# Patient Record
Sex: Male | Born: 1990 | Race: Black or African American | Hispanic: No | Marital: Single | Smoking: Current every day smoker
Health system: Southern US, Community
[De-identification: ages and names within clinical notes are randomized; demographics above are authoritative.]

---
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
## 2013-02-15 ENCOUNTER — Encounter (HOSPITAL_COMMUNITY): Payer: Self-pay | Admitting: *Deleted

## 2013-02-15 ENCOUNTER — Emergency Department (HOSPITAL_COMMUNITY)
Admission: EM | Admit: 2013-02-15 | Discharge: 2013-02-15 | Disposition: A | Discharge status: Home / Self Care | Payer: Medicaid Other | Attending: Emergency Medicine | Admitting: Emergency Medicine

## 2013-02-15 DIAGNOSIS — R1033 Periumbilical pain: Secondary | ICD-10-CM | POA: Insufficient documentation

## 2013-02-15 DIAGNOSIS — F17200 Nicotine dependence, unspecified, uncomplicated: Secondary | ICD-10-CM | POA: Insufficient documentation

## 2013-02-15 DIAGNOSIS — K5289 Other specified noninfective gastroenteritis and colitis: Secondary | ICD-10-CM | POA: Insufficient documentation

## 2013-02-15 DIAGNOSIS — K529 Noninfective gastroenteritis and colitis, unspecified: Secondary | ICD-10-CM

## 2013-02-15 DIAGNOSIS — R002 Palpitations: Secondary | ICD-10-CM | POA: Insufficient documentation

## 2013-02-15 DIAGNOSIS — R509 Fever, unspecified: Secondary | ICD-10-CM | POA: Insufficient documentation

## 2013-02-15 LAB — URINALYSIS, ROUTINE W REFLEX MICROSCOPIC
Ketones, ur: NEGATIVE mg/dL
Leukocytes, UA: NEGATIVE
Specific Gravity, Urine: 1.034 — ABNORMAL HIGH (ref 1.005–1.030)
Urobilinogen, UA: 1 mg/dL (ref 0.0–1.0)

## 2013-02-15 LAB — COMPREHENSIVE METABOLIC PANEL
ALT: 9 U/L (ref 0–53)
Alkaline Phosphatase: 77 U/L (ref 39–117)
Chloride: 99 mEq/L (ref 96–112)
Creatinine, Ser: 1.05 mg/dL (ref 0.50–1.35)
GFR calc Af Amer: >90 mL/min (ref >90)
Glucose, Bld: 127 mg/dL — ABNORMAL HIGH (ref 70–99)
Potassium: 3 mEq/L — ABNORMAL LOW (ref 3.5–5.1)
Total Protein: 7 g/dL (ref 6.0–8.3)

## 2013-02-15 LAB — CBC WITH DIFFERENTIAL/PLATELET
Eosinophils Absolute: 0 10*3/uL (ref 0.0–0.7)
Hemoglobin: 14.9 g/dL (ref 13.0–17.0)
Lymphs Abs: 0.4 10*3/uL — ABNORMAL LOW (ref 0.7–4.0)
MCH: 30.5 pg (ref 26.0–34.0)
MCHC: 35.5 g/dL (ref 30.0–36.0)
Monocytes Relative: 4 % (ref 3–12)
Neutro Abs: 4.1 10*3/uL (ref 1.7–7.7)
Neutrophils Relative %: 88 % — ABNORMAL HIGH (ref 43–77)
Platelets: 242 10*3/uL (ref 150–400)

## 2013-02-15 MED ORDER — PROMETHAZINE HCL 25 MG/ML IJ SOLN
25.0000 mg | INTRAMUSCULAR | Status: DC | PRN
  Administered 2013-02-15: 25 mg via INTRAVENOUS
  Filled 2013-02-15: qty 1

## 2013-02-15 MED ORDER — ONDANSETRON HCL 4 MG/2ML IJ SOLN
4.0000 mg | Freq: Once | INTRAMUSCULAR | Status: AC
  Administered 2013-02-15: 4 mg via INTRAVENOUS
  Filled 2013-02-15: qty 2

## 2013-02-15 MED ORDER — POTASSIUM CHLORIDE 10 MEQ/100ML IV SOLN
10.0000 meq | Freq: Once | INTRAVENOUS | Status: AC
  Administered 2013-02-15: 10 meq via INTRAVENOUS
  Filled 2013-02-15: qty 100

## 2013-02-15 MED ORDER — FENTANYL CITRATE 0.05 MG/ML IJ SOLN
50.0000 ug | Freq: Once | INTRAMUSCULAR | Status: AC
  Administered 2013-02-15: 50 ug via INTRAVENOUS
  Filled 2013-02-15: qty 2

## 2013-02-15 MED ORDER — PROMETHAZINE HCL 25 MG PO TABS: 25.0000 mg | ORAL_TABLET | Freq: Four times a day (QID) | ORAL | Status: AC | PRN

## 2013-02-15 MED ORDER — ACETAMINOPHEN 325 MG PO TABS
650.0000 mg | ORAL_TABLET | Freq: Once | ORAL | Status: AC
  Administered 2013-02-15: 650 mg via ORAL
  Filled 2013-02-15: qty 2

## 2013-02-15 MED ORDER — ONDANSETRON 8 MG PO TBDP
8.0000 mg | ORAL_TABLET | Freq: Once | ORAL | Status: DC
  Filled 2013-02-15: qty 1

## 2013-02-15 MED ORDER — SODIUM CHLORIDE 0.9 % IV BOLUS (SEPSIS)
1000.0000 mL | Freq: Once | INTRAVENOUS | Status: AC
  Administered 2013-02-15: 1000 mL via INTRAVENOUS

## 2013-02-15 NOTE — ED Provider Notes (Signed)
History    CSN: [id number] Arrival date & time 02/15/13  1947  First MD Initiated Contact with Patient 02/15/13 2010     Chief Complaint  Patient presents with  . Emesis    HPI Patient presents with nausea, vomiting, fever and periumbilical discomfort since around 7 AM.  Patient's had no diarrhea.  Has no significant medical history.  Is on no current medication.  History reviewed. No pertinent past medical history. History reviewed. No pertinent past surgical history. No family history on file. History  Substance Use Topics  . Smoking status: Current Every Day Smoker  . Smokeless tobacco: Not on file  . Alcohol Use: Yes     Comment: occ    Review of Systems All other systems reviewed and are negative Allergies  Review of patient's allergies indicates no known allergies.  Home Medications   Current Outpatient Rx  Name  Route  Sig  Dispense  Refill  . promethazine (PHENERGAN) 25 MG tablet   Oral   Take 1 tablet (25 mg total) by mouth every 6 (six) hours as needed for nausea.   20 tablet   0    BP 126/68  Pulse 110  Temp(Src) 100.5 F (38.1 C) (Oral)  Resp 19  Ht 5\' 10"  (1.778 m)  Wt 150 lb (68.04 kg)  BMI 21.52 kg/m2  SpO2 97% Physical Exam  Nursing note and vitals reviewed. Constitutional: He is oriented to person, place, and time. He appears well-developed and well-nourished. No distress.  HENT:  Head: Normocephalic and atraumatic.  Eyes: Pupils are equal, round, and reactive to light.  Neck: Normal range of motion.  Cardiovascular: Intact distal pulses.  Tachycardia present.   Pulmonary/Chest: No respiratory distress.  Abdominal: Soft. Normal appearance and bowel sounds are normal. He exhibits no distension. There is no tenderness. There is no rebound and no guarding.  Musculoskeletal: Normal range of motion.  Neurological: He is alert and oriented to person, place, and time. No cranial nerve deficit.  Skin: Skin is warm and dry. No rash noted.   Psychiatric: He has a normal mood and affect. His behavior is normal.    ED Course  Procedures (including critical care time) Medications  promethazine (PHENERGAN) injection 25 mg (25 mg Intravenous Given 02/15/13 2230)  sodium chloride 0.9 % bolus 1,000 mL (0 mLs Intravenous Stopped 02/15/13 2225)  ondansetron (ZOFRAN) injection 4 mg (4 mg Intravenous Given 02/15/13 2050)  fentaNYL (SUBLIMAZE) injection 50 mcg (50 mcg Intravenous Given 02/15/13 2049)  potassium chloride 10 mEq in 100 mL IVPB (0 mEq Intravenous Stopped 02/15/13 2230)  acetaminophen (TYLENOL) tablet 650 mg (650 mg Oral Given 02/15/13 2121)    Labs Reviewed  COMPREHENSIVE METABOLIC PANEL - Abnormal; Notable for the following:    Potassium 3.0 (*)    Glucose, Bld 127 (*)    All other components within normal limits  CBC WITH DIFFERENTIAL - Abnormal; Notable for the following:    Neutrophils Relative % 88 (*)    Lymphocytes Relative 8 (*)    Lymphs Abs 0.4 (*)    All other components within normal limits  URINALYSIS, ROUTINE W REFLEX MICROSCOPIC - Abnormal; Notable for the following:    Color, Urine AMBER (*)    Specific Gravity, Urine 1.034 (*)    Bilirubin Urine SMALL (*)    All other components within normal limits  LIPASE, BLOOD   No results found. 1. Gastroenteritis     MDM  After treatment in the ED the patient feels  back to baseline and wants to go home.  Nelia Shi, MD 02/15/13 2239

## 2013-02-15 NOTE — ED Notes (Signed)
Pt states he has vomited 7 times since 0700. Fever all day, sudden onset of events around 0700. No diarrhea

## 2021-04-16 ENCOUNTER — Emergency Department (HOSPITAL_COMMUNITY): Payer: Medicaid Other

## 2021-04-16 ENCOUNTER — Other Ambulatory Visit: Payer: Self-pay

## 2021-04-16 ENCOUNTER — Emergency Department (HOSPITAL_COMMUNITY)
Admission: EM | Admit: 2021-04-16 | Discharge: 2021-04-16 | Disposition: A | Discharge status: Home / Self Care | Payer: Medicaid Other | Attending: Emergency Medicine | Admitting: Emergency Medicine

## 2021-04-16 ENCOUNTER — Encounter (HOSPITAL_COMMUNITY): Payer: Self-pay

## 2021-04-16 DIAGNOSIS — S6991XA Unspecified injury of right wrist, hand and finger(s), initial encounter: Secondary | ICD-10-CM

## 2021-04-16 DIAGNOSIS — M79644 Pain in right finger(s): Secondary | ICD-10-CM | POA: Insufficient documentation

## 2021-04-16 DIAGNOSIS — F17210 Nicotine dependence, cigarettes, uncomplicated: Secondary | ICD-10-CM | POA: Insufficient documentation

## 2021-04-16 NOTE — Progress Notes (Signed)
Orthopedic Tech Progress Note Patient Details:  Timothy Miranda 1990-11-16 [id number]  Ortho Devices Type of Ortho Device: Finger splint Ortho Device/Splint Location: right Ortho Device/Splint Interventions: Application   Post Interventions Patient Tolerated: Well Instructions Provided: Care of device  Saul Fordyce 04/16/2021, 12:17 PM

## 2021-04-16 NOTE — Discharge Instructions (Signed)
You were seen in the ER today for your finger injury.  There is no broken bone on your x-ray.  Suspect you have an injury to the ligaments or tendons of your finger. You have been placed in a splint.  Please call the office of the orthopedic providers listed below to schedule follow-up appointment within the next week.  You may use Tylenol or ibuprofen as needed.  You may ice the area as needed.  Return to the ER with any new numbness, tingling, weakness in her hand, or any Dmitriy symptoms.

## 2021-04-16 NOTE — ED Triage Notes (Signed)
Patient complains of right pinky finger pain after shutting in car door 2 days ago. Patient with swelling to same and unable to straighten finger out.

## 2021-04-16 NOTE — ED Notes (Signed)
Ortho notified pt need splint

## 2021-04-16 NOTE — ED Provider Notes (Signed)
[facility] EMERGENCY DEPARTMENT Provider Note   CSN: [id number] Arrival date & time: 04/16/21  1017     History No chief complaint on file.   Timothy Miranda is a 30 y.o. male who present with concern for pain and inability to straighten his left pinky, 2 days after closing it in a car door.  Patient states that he has history of injury to his finger when he played football, however thought that it was just jammed.  States that when he pulled on it to "unjam it" he felt a popping sensation and since then has no longer been able to completely straighten his finger.  He denies any numbness or tingling in the finger but does endorse mild pain at the PIP joint.  He has used both Tylenol and ibuprofen at home with improvement in his pain, however the ibuprofen has made him nauseous.  He is ice to the area but has some persistent mild swelling.  I personally reads patient medical records.  Has not care any medical diagnoses and is not on medications every day.  HPI     History reviewed. No pertinent past medical history.  There are no problems to display for this patient.   History reviewed. No pertinent surgical history.     No family history on file.  Social History   Tobacco Use   Smoking status: Every Day  Substance Use Topics   Alcohol use: Yes    Comment: occ   Drug use: No    Home Medications Prior to Admission medications   Medication Sig Start Date End Date Taking? Authorizing Provider  promethazine (PHENERGAN) 25 MG tablet Take 1 tablet (25 mg total) by mouth every 6 (six) hours as needed for nausea. 02/15/13   Nelva Nay, MD    Allergies    Patient has no known allergies.  Review of Systems   Review of Systems  Constitutional: Negative.   HENT: Negative.    Respiratory: Negative.    Cardiovascular: Negative.   Gastrointestinal: Negative.   Musculoskeletal:  Positive for arthralgias.  Neurological: Negative.    Physical  Exam Updated Vital Signs BP 114/76 (BP Location: Left Arm)   Pulse 63   Temp 97.9 F (36.6 C) (Oral)   Resp 16   SpO2 100%   Physical Exam Vitals and nursing note reviewed.  HENT:     Head: Normocephalic and atraumatic.  Eyes:     General: No scleral icterus.       Right eye: No discharge.        Left eye: No discharge.     Conjunctiva/sclera: Conjunctivae normal.  Pulmonary:     Effort: Pulmonary effort is normal.  Musculoskeletal:     Right wrist: Normal.     Left wrist: Normal.     Right hand: Tenderness and bony tenderness present. Decreased range of motion. Normal sensation. Normal capillary refill.     Left hand: Normal.       Hands:     Comments: TTP of the PIP joint of the right fifth finger.  Patient unable to actively fully extend the PIP joint of the finger.  Additionally this provider unable to passively extend the PIP joint of the right fifth finger.  Skin:    General: Skin is warm and dry.     Capillary Refill: Capillary refill takes less than 2 seconds.  Neurological:     General: No focal deficit present.     Mental Status: He is  alert.     Sensory: Sensation is intact.     Gait: Gait is intact.  Psychiatric:        Mood and Affect: Mood normal.    ED Results / Procedures / Treatments   Labs (all labs ordered are listed, but only abnormal results are displayed) Labs Reviewed - No data to display  EKG None  Radiology DG Hand Complete Right  Result Date: 04/16/2021 CLINICAL DATA:  Right fifth finger pain after injury 2 days ago EXAM: RIGHT HAND - COMPLETE 3+ VIEW COMPARISON:  None. FINDINGS: Sessile excrescence from the fifth proximal phalanx shaft which could be from old trauma or osteochondroma. No acute fracture or subluxation. IMPRESSION: No acute finding Electronically Signed   By: Tiburcio Pea M.D.   On: 04/16/2021 11:00    Procedures Procedures   Medications Ordered in ED Medications - No data to display  ED Course  I have reviewed  the triage vital signs and the nursing notes.  Pertinent labs & imaging results that were available during my care of the patient were reviewed by me and considered in my medical decision making (see chart for details).    MDM Rules/Calculators/A&P                         30 year old male who presents with concern for injury to the right pinky 2 days ago.  Differential diagnosis includes but is limited to tenderness/ligamentous injury, acute fracture dislocation, contusion.  Vital signs are normal on intake.  Physical exam reveals findings concerning for possible tendon entrapment.  Patient is neurovascularly intact in the digit.  Able to fully flex the finger but unable to fully extend the PIP joint only.  Tenderness palpation.  Normal cap refill.  Case discussed with Dr. Eulah Pont, orthopedics, who recommends splinting the joint and outpatient follow-up in the office this week.  I appreciate his collaboration in the care of this patient.  Finger was splinted and patient may be discharged.  No further work-up warranted in ED as time given reassuring imaging without fracture or dislocation.  Tracer voiced understanding of medical evaluation and treatment plan.  Each of his questions answered to his expressed satisfaction.  Return precautions were given.  Patient is well-appearing, stable, and appropriate for discharge at this time.   This chart was dictated using voice recognition software, Dragon. Despite the best efforts of this provider to proofread and correct errors, errors may still occur which can change documentation meaning.  Final Clinical Impression(s) / ED Diagnoses Final diagnoses:  None    Rx / DC Orders ED Discharge Orders     None        Sherrilee Gilles 04/16/21 1242    Milagros Loll, MD 04/24/21 [phone]

## 2021-04-16 NOTE — ED Provider Notes (Signed)
Emergency Medicine Provider Triage Evaluation Note  Timothy Miranda , a 30 y.o. male  was evaluated in triage.  Pt complains of right fifth finger pain that began on Friday.  States he slammed his hand in a car door.  Had swelling to the PIP and pulled on it and felt a popping sensation now is unable to fully extend the right pinky.  No weakness or numbness to the finger or hand.  Review of Systems  Positive:  Negative: See above  Physical Exam  BP 114/76 (BP Location: Left Arm)   Pulse 63   Temp 97.9 F (36.6 C) (Oral)   Resp 16   SpO2 100%  Gen:   Awake, no distress   Resp:  Normal effort  MSK:   Moves extremities without difficulty  Other:  Right fifth PIP stuck in flexion, has normal strength in the DIP  Medical Decision Making  Medically screening exam initiated at 10:29 AM.  Appropriate orders placed.  Timothy Miranda was informed that the remainder of the evaluation will be completed by another provider, this initial triage assessment does not replace that evaluation, and the importance of remaining in the ED until their evaluation is complete.     Honor Loh [place], PA-C 04/16/21 1031    Derwood Kaplan, MD 04/18/21 [phone]

## 2021-06-25 ENCOUNTER — Encounter (HOSPITAL_COMMUNITY): Payer: Self-pay

## 2021-06-25 ENCOUNTER — Ambulatory Visit (HOSPITAL_COMMUNITY)
Admission: EM | Admit: 2021-06-25 | Discharge: 2021-06-25 | Disposition: A | Discharge status: Home / Self Care | Payer: Medicaid Other | Attending: Emergency Medicine | Admitting: Emergency Medicine

## 2021-06-25 ENCOUNTER — Other Ambulatory Visit: Payer: Self-pay

## 2021-06-25 DIAGNOSIS — R369 Urethral discharge, unspecified: Secondary | ICD-10-CM | POA: Insufficient documentation

## 2021-06-25 MED ORDER — CEFTRIAXONE SODIUM 500 MG IJ SOLR
500.0000 mg | Freq: Once | INTRAMUSCULAR | Status: AC
  Administered 2021-06-25: 16:00:00 500 mg via INTRAMUSCULAR

## 2021-06-25 MED ORDER — LIDOCAINE HCL (PF) 1 % IJ SOLN
INTRAMUSCULAR | Status: AC
  Filled 2021-06-25: qty 2

## 2021-06-25 MED ORDER — CEFTRIAXONE SODIUM 500 MG IJ SOLR
INTRAMUSCULAR | Status: AC
  Filled 2021-06-25: qty 500

## 2021-06-25 MED ORDER — DOXYCYCLINE HYCLATE 100 MG PO CAPS: 100.0000 mg | ORAL_CAPSULE | Freq: Two times a day (BID) | ORAL | 0 refills | Status: AC

## 2021-06-25 NOTE — Discharge Instructions (Addendum)
Avoid all forms of sexual intercourse (oral, vaginal, anal) for the next 7 days to avoid spreading/reinfecting or at least until we can see what kinds of infection results are positive. Return if symptoms worsen/do not resolve, you develop fever, abdominal pain, blood in your urine, or are re-exposed to a sexually transmitted infection (STI).  

## 2021-06-25 NOTE — ED Triage Notes (Signed)
Pt presents with discharge from urethra starting  today declines pain

## 2021-06-25 NOTE — ED Provider Notes (Signed)
  [facility]   MRN: [id number] DOB: 1991/07/25  Subjective:   Timothy Miranda is a 30 y.o. male presenting for 2 day history of acute onset penile discharge. Had unprotected sex 2 nights ago. Has 1 male partner. Generally tries to use condoms. Denies dysuria, hematuria, urinary frequency, penile swelling, testicular pain, testicular swelling, anal pain, groin pain.   No current facility-administered medications for this encounter.  Current Outpatient Medications:    promethazine (PHENERGAN) 25 MG tablet, Take 1 tablet (25 mg total) by mouth every 6 (six) hours as needed for nausea., Disp: 20 tablet, Rfl: 0   No Known Allergies  History reviewed. No pertinent past medical history.   History reviewed. No pertinent surgical history.  History reviewed. No pertinent family history.  Social History   Tobacco Use   Smoking status: Every Day  Substance Use Topics   Alcohol use: Yes    Comment: occ   Drug use: Yes    Types: Marijuana    Comment: daily    ROS   Objective:   Vitals: BP 121/73   Pulse 100   Temp 98.7 F (37.1 C) (Oral)   Resp 18   Ht 5\' 9"  (1.753 m)   Wt 159 lb (72.1 kg)   SpO2 98%   BMI 23.48 kg/m   Physical Exam Constitutional:      General: He is not in acute distress.    Appearance: Normal appearance. He is well-developed and normal weight. He is not ill-appearing, toxic-appearing or diaphoretic.  HENT:     Head: Normocephalic and atraumatic.     Right Ear: External ear normal.     Left Ear: External ear normal.     Nose: Nose normal.     Mouth/Throat:     Pharynx: Oropharynx is clear.  Eyes:     General: No scleral icterus.       Right eye: No discharge.        Left eye: No discharge.     Extraocular Movements: Extraocular movements intact.     Pupils: Pupils are equal, round, and reactive to light.  Cardiovascular:     Rate and Rhythm: Normal rate.  Pulmonary:     Effort: Pulmonary effort is normal.   Genitourinary:    Penis: Discharge present. No phimosis, paraphimosis, hypospadias, erythema, tenderness, swelling or lesions.   Musculoskeletal:     Cervical back: Normal range of motion.  Neurological:     Mental Status: He is alert and oriented to person, place, and time.  Psychiatric:        Mood and Affect: Mood normal.        Behavior: Behavior normal.        Thought Content: Thought content normal.        Judgment: Judgment normal.    Assessment and Plan :   PDMP not reviewed this encounter.  1. Penile discharge    Patient treated empirically as per CDC guidelines with IM ceftriaxone, doxycycline as an outpatient.  Labs pending.   Counseled on safe sex practices including abstaining for 1 week following treatment.  Counseled patient on potential for adverse effects with medications prescribed/recommended today, ER and return-to-clinic precautions discussed, patient verbalized understanding.    , PA-C 06/25/21 1614

## 2021-06-26 LAB — CYTOLOGY, (ORAL, ANAL, URETHRAL) ANCILLARY ONLY
Chlamydia: NEGATIVE
Comment: NEGATIVE
Comment: NEGATIVE
Comment: NORMAL
Neisseria Gonorrhea: POSITIVE — AB
Trichomonas: NEGATIVE
